# Patient Record
Sex: Male | Born: 1990 | Race: Black or African American | Hispanic: No | Marital: Single | Smoking: Current some day smoker
Health system: Southern US, Community
[De-identification: ages and names within clinical notes are randomized; demographics above are authoritative.]

## PROBLEM LIST (undated history)

## (undated) HISTORY — PX: WISDOM TOOTH EXTRACTION: SHX21

---

## 2014-11-04 ENCOUNTER — Emergency Department (HOSPITAL_COMMUNITY)
Admission: EM | Admit: 2014-11-04 | Discharge: 2014-11-04 | Disposition: A | Discharge status: Home / Self Care | Payer: Self-pay | Attending: Emergency Medicine | Admitting: Emergency Medicine

## 2014-11-04 NOTE — ED Notes (Signed)
Pt called for triage x2 with no response.  

## 2014-11-05 ENCOUNTER — Ambulatory Visit: Payer: Self-pay

## 2014-11-05 ENCOUNTER — Other Ambulatory Visit: Payer: Self-pay | Admitting: Occupational Medicine

## 2014-11-05 DIAGNOSIS — R52 Pain, unspecified: Secondary | ICD-10-CM

## 2015-12-31 ENCOUNTER — Encounter (HOSPITAL_COMMUNITY): Payer: Self-pay | Admitting: Emergency Medicine

## 2015-12-31 ENCOUNTER — Emergency Department (HOSPITAL_COMMUNITY)
Admission: EM | Admit: 2015-12-31 | Discharge: 2016-01-01 | Disposition: A | Discharge status: Home / Self Care | Payer: No Typology Code available for payment source | Attending: Emergency Medicine | Admitting: Emergency Medicine

## 2015-12-31 DIAGNOSIS — F17200 Nicotine dependence, unspecified, uncomplicated: Secondary | ICD-10-CM | POA: Insufficient documentation

## 2015-12-31 DIAGNOSIS — Y9389 Activity, other specified: Secondary | ICD-10-CM | POA: Insufficient documentation

## 2015-12-31 DIAGNOSIS — Y998 Other external cause status: Secondary | ICD-10-CM | POA: Insufficient documentation

## 2015-12-31 DIAGNOSIS — Y92410 Unspecified street and highway as the place of occurrence of the external cause: Secondary | ICD-10-CM | POA: Diagnosis not present

## 2015-12-31 DIAGNOSIS — S29001A Unspecified injury of muscle and tendon of front wall of thorax, initial encounter: Secondary | ICD-10-CM | POA: Diagnosis present

## 2015-12-31 DIAGNOSIS — S20211A Contusion of right front wall of thorax, initial encounter: Secondary | ICD-10-CM | POA: Diagnosis not present
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
## 2016-01-01 ENCOUNTER — Emergency Department (HOSPITAL_COMMUNITY): Payer: No Typology Code available for payment source

## 2016-01-01 DIAGNOSIS — S20211A Contusion of right front wall of thorax, initial encounter: Secondary | ICD-10-CM | POA: Diagnosis not present

## 2016-01-01 MED ORDER — CYCLOBENZAPRINE HCL 10 MG PO TABS: 10.0000 mg | ORAL_TABLET | Freq: Three times a day (TID) | ORAL | Status: AC | PRN

## 2016-01-01 MED ORDER — IBUPROFEN 800 MG PO TABS: 800.0000 mg | ORAL_TABLET | Freq: Three times a day (TID) | ORAL | Status: AC

## 2016-01-01 MED ORDER — TRAMADOL HCL 50 MG PO TABS: 50.0000 mg | ORAL_TABLET | Freq: Four times a day (QID) | ORAL | Status: AC | PRN
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
## 2016-01-01 NOTE — Discharge Instructions (Signed)
Chest Contusion °A contusion is a deep bruise. Bruises happen when an injury causes bleeding under the skin. Signs of bruising include pain, puffiness (swelling), and discolored skin. The bruise may turn blue, purple, or yellow.  °HOME CARE °· Put ice on the injured area. °¨ Put ice in a plastic bag. °¨ Place a towel between the skin and the bag. °¨ Leave the ice on for 15-20 minutes at a time, 03-04 times a day for the first 48 hours. °· Only take medicine as told by your doctor. °· Rest. °· Take deep breaths (deep-breathing exercises) as told by your doctor. °· Stop smoking if you smoke. °· Do not lift objects over 5 pounds (2.3 kilograms) for 3 days or longer if told by your doctor. °GET HELP RIGHT AWAY IF:  °· You have more bruising or puffiness. °· You have pain that gets worse. °· You have trouble breathing. °· You are dizzy, weak, or pass out (faint). °· You have blood in your pee (urine) or poop (stool). °· You cough up or throw up (vomit) blood. °· Your puffiness or pain is not helped with medicines. °MAKE SURE YOU:  °· Understand these instructions. °· Will watch your condition. °· Will get help right away if you are not doing well or get worse. °  °This information is not intended to replace advice given to you by your health care provider. Make sure you discuss any questions you have with your health care provider. °  °Document Released: 03/09/2008 Document Revised: 06/15/2012 Document Reviewed: 03/14/2012 °[employer]vier Interactive Patient Education ©2016 [employer] ° °
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
